# Patient Record
Sex: Male | Born: 1970
Health system: Southern US, Community
[De-identification: ages and names within clinical notes are randomized; demographics above are authoritative.]

## PROBLEM LIST (undated history)

## (undated) DIAGNOSIS — H532 Diplopia: Principal | ICD-10-CM

## (undated) DIAGNOSIS — T7840XA Allergy, unspecified, initial encounter: Secondary | ICD-10-CM

## (undated) DIAGNOSIS — G7000 Myasthenia gravis without (acute) exacerbation: Principal | ICD-10-CM

## (undated) HISTORY — PX: NO PAST SURGERIES: SHX2092

## (undated) HISTORY — DX: Diplopia: H53.2

## (undated) HISTORY — DX: Myasthenia gravis without (acute) exacerbation: G70.00

## (undated) HISTORY — DX: Allergy, unspecified, initial encounter: T78.40XA

---

## 2009-01-11 ENCOUNTER — Ambulatory Visit: Payer: Self-pay | Admitting: Sports Medicine

## 2009-01-11 DIAGNOSIS — M629 Disorder of muscle, unspecified: Secondary | ICD-10-CM | POA: Insufficient documentation

## 2009-01-11 DIAGNOSIS — M25559 Pain in unspecified hip: Secondary | ICD-10-CM | POA: Insufficient documentation

## 2016-05-24 ENCOUNTER — Other Ambulatory Visit (HOSPITAL_COMMUNITY): Payer: Self-pay | Admitting: Internal Medicine

## 2016-05-24 ENCOUNTER — Ambulatory Visit (HOSPITAL_COMMUNITY)
Admission: RE | Admit: 2016-05-24 | Discharge: 2016-05-24 | Disposition: A | Discharge status: Home / Self Care | Payer: BLUE CROSS/BLUE SHIELD | Source: Ambulatory Visit | Attending: Internal Medicine | Admitting: Internal Medicine

## 2016-05-24 DIAGNOSIS — R519 Headache, unspecified: Secondary | ICD-10-CM

## 2016-05-24 DIAGNOSIS — H539 Unspecified visual disturbance: Secondary | ICD-10-CM | POA: Insufficient documentation

## 2016-05-24 DIAGNOSIS — R51 Headache: Secondary | ICD-10-CM | POA: Diagnosis not present

## 2016-05-24 DIAGNOSIS — H5310 Unspecified subjective visual disturbances: Secondary | ICD-10-CM | POA: Diagnosis not present

## 2016-05-24 DIAGNOSIS — H532 Diplopia: Secondary | ICD-10-CM | POA: Diagnosis not present

## 2016-05-24 DIAGNOSIS — H04123 Dry eye syndrome of bilateral lacrimal glands: Secondary | ICD-10-CM | POA: Diagnosis not present

## 2016-05-24 DIAGNOSIS — R9089 Other abnormal findings on diagnostic imaging of central nervous system: Secondary | ICD-10-CM | POA: Diagnosis not present

## 2016-05-24 DIAGNOSIS — Z6822 Body mass index (BMI) 22.0-22.9, adult: Secondary | ICD-10-CM | POA: Diagnosis not present

## 2016-05-24 MED ORDER — GADOBENATE DIMEGLUMINE 529 MG/ML IV SOLN
15.0000 mL | Freq: Once | INTRAVENOUS | Status: AC | PRN
  Administered 2016-05-24: 15 mL via INTRAVENOUS

## 2016-06-07 DIAGNOSIS — H532 Diplopia: Secondary | ICD-10-CM | POA: Diagnosis not present

## 2016-06-07 DIAGNOSIS — H5015 Alternating exotropia: Secondary | ICD-10-CM | POA: Diagnosis not present

## 2016-06-07 DIAGNOSIS — H04123 Dry eye syndrome of bilateral lacrimal glands: Secondary | ICD-10-CM | POA: Diagnosis not present

## 2016-06-11 DIAGNOSIS — H539 Unspecified visual disturbance: Secondary | ICD-10-CM | POA: Diagnosis not present

## 2016-06-11 DIAGNOSIS — H5034 Intermittent alternating exotropia: Secondary | ICD-10-CM | POA: Diagnosis not present

## 2016-06-11 DIAGNOSIS — H0520 Unspecified exophthalmos: Secondary | ICD-10-CM | POA: Diagnosis not present

## 2016-06-11 DIAGNOSIS — H5021 Vertical strabismus, right eye: Secondary | ICD-10-CM | POA: Diagnosis not present

## 2016-06-13 ENCOUNTER — Telehealth: Payer: Self-pay | Admitting: *Deleted

## 2016-06-13 NOTE — Telephone Encounter (Signed)
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  Dr. Jannifer Franklin sent a staff message requesting he be placed on his schedule.

## 2016-06-13 NOTE — Telephone Encounter (Signed)
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

## 2016-06-25 ENCOUNTER — Encounter: Payer: Self-pay | Admitting: Neurology

## 2016-06-25 ENCOUNTER — Ambulatory Visit (INDEPENDENT_AMBULATORY_CARE_PROVIDER_SITE_OTHER): Payer: BLUE CROSS/BLUE SHIELD | Admitting: Neurology

## 2016-06-25 VITALS — BP 114/76 | HR 58 | Ht 71.0 in | Wt 166.5 lb

## 2016-06-25 DIAGNOSIS — H532 Diplopia: Secondary | ICD-10-CM

## 2016-06-25 HISTORY — DX: Diplopia: H53.2

## 2016-06-25 MED ORDER — PYRIDOSTIGMINE BROMIDE 60 MG PO TABS: 60.0000 mg | ORAL_TABLET | Freq: Four times a day (QID) | ORAL | Status: DC

## 2016-06-25 NOTE — Progress Notes (Signed)
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 The patient was driving at the time, he noted onset of some double vision and he noted that if he closed one eye or the other the double vision would go away. The patient may have blurring of vision but not overt double vision at times. He was seen by Dr. Annamaria Boots from ophthalmology, he was told that myasthenia gravis could be a possibility. MRI of the brain has been done and shows minimal nonspecific white matter changes. The patient has had blood work that includes a thyroid profile, thyroid peroxidase antibody, thyroglobulin antibody, and an ACH antibody, binding, that were all normal. The patient has been placed on Mestinon, he appears to have some benefit taking 60 mg 3 times daily. He was given a prescription for prednisone, but he did not start this. The patient does believe that he gets worse as the day goes on. If he rests briefly, the double vision seems to improve. The double vision may be variable, sometimes horizontal, sometimes vertical, sometimes skew. He denies any ptosis whatsoever. He denies any weakness of the extremities or numbness of the extremities. Denies any balance issues or difficulty controlling the bowels or the bladder. 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  Past Medical History  Diagnosis Date  . Diplopia 06/25/2016    History reviewed. No pertinent past surgical history.  Family History  Problem Relation Age of Onset  . Autoimmune disease Mother     Social history:  reports that he has never smoked. He does not have any smokeless tobacco history on file. He reports that he drinks alcohol. He reports that he does not use illicit drugs.  Medications:  Prior to Admission  medications   Not on File      Allergies  Allergen Reactions  . Amoxicillin Rash    ROS:  Out of a complete 14 system review of symptoms, the patient complains only of the following symptoms, and all other reviewed systems are negative.  Blurred vision, double vision Dizziness  Blood pressure 114/76, pulse 58, height 5\' 11"  (1.803 m), weight 166 lb 8 oz (75.524 kg).  Physical Exam  General: The patient is alert and cooperative at the time of the examination.  Eyes: Pupils are equal, round, and reactive to light. Discs are flat bilaterally.  Neck: The neck is supple, no carotid bruits are noted.  Respiratory: The respiratory examination is clear.  Cardiovascular: The cardiovascular examination reveals a regular rate and rhythm, no obvious murmurs or rubs are noted.  Skin: Extremities are without significant edema.  Neurologic Exam  Mental status: The patient is alert and oriented x 3 at the time of the examination. The patient has apparent normal recent and remote memory, with an apparently normal attention span and concentration ability.  Cranial nerves: Facial symmetry is present. There is good sensation of the face to pinprick and soft touch bilaterally. The strength of the facial muscles and the muscles to head turning and shoulder shrug are normal bilaterally. Speech is well enunciated, no aphasia or dysarthria is noted. Extraocular movements are full, with exception that there is incomplete adduction of the right eye with left lateral gaze. Visual fields are full. The tongue is midline, and  the patient has symmetric elevation of the soft palate. No obvious hearing deficits are noted. With superior gaze for 1 minute, there is exotropia of the right eye. No ptosis is seen.  Motor: The motor testing reveals 5 over 5 strength of all 4 extremities. Good symmetric motor tone is noted throughout. With arms outstretched 1 minute, no fatigable weakness of the deltoid muscles is  noted.  Sensory: Sensory testing is intact to pinprick, soft touch, vibration sensation, and position sense on all 4 extremities. No evidence of extinction is noted.  Coordination: Cerebellar testing reveals good finger-nose-finger and heel-to-shin bilaterally.  Gait and station: Gait is normal. Tandem gait is normal. Romberg is negative. No drift is seen.  Reflexes: Deep tendon reflexes are symmetric and normal bilaterally. Toes are downgoing bilaterally.    MRI brain 05/24/16:   IMPRESSION: 1. No acute intracranial abnormality. 2. 3 punctate foci of white matter T2 signal abnormality, nonspecific and not necessarily abnormal for patient's age. No changes specifically suggestive of demyelinating disease. 3. Unremarkable appearance of the orbits.  * MRI scan images were reviewed online. I agree with the written report.   Assessment/Plan:  1. Double vision, possible myasthenia gravis  The blood work so far for myasthenia gravis is negative, the patient may be seronegative with ocular myasthenia. The patient will be sent for further blood work, and he will have a CT scan of the chest. The patient appears to desire a referral to [facility], he wishes to have a thymectomy performed. I indicated that a thymectomy in an ocular myasthenic is not indicated. We may consider referral however for single fiber EMG if the response to the Mestinon is not definite. We will go up on the Mestinon taking 60 mg 4 times daily. He will follow-up in 2 months.  Jill Alexanders MD 06/25/2016 9:01 PM  [facility] [address] [place], [place] [address]  Phone [phone] Fax [phone]

## 2016-07-02 ENCOUNTER — Ambulatory Visit
Admission: RE | Admit: 2016-07-02 | Discharge: 2016-07-02 | Disposition: A | Discharge status: Home / Self Care | Payer: BLUE CROSS/BLUE SHIELD | Source: Ambulatory Visit | Attending: Neurology | Admitting: Neurology

## 2016-07-02 ENCOUNTER — Telehealth: Payer: Self-pay | Admitting: Neurology

## 2016-07-02 DIAGNOSIS — H532 Diplopia: Secondary | ICD-10-CM

## 2016-07-02 DIAGNOSIS — G7000 Myasthenia gravis without (acute) exacerbation: Secondary | ICD-10-CM | POA: Diagnosis not present

## 2016-07-02 MED ORDER — IOPAMIDOL (ISOVUE-300) INJECTION 61%
75.0000 mL | Freq: Once | INTRAVENOUS | Status: AC | PRN
  Administered 2016-07-02: 75 mL via INTRAVENOUS

## 2016-07-02 NOTE — Telephone Encounter (Signed)
Patient called, request that order for single or sickle fiber order be faxed to [facility] [phone], Attn: Shericka/Dr. April Manson

## 2016-07-02 NOTE — Telephone Encounter (Signed)
I called the patient, CT the chest is unremarkable. We are getting a referral set up to [facility] for a single fiber EMG.

## 2016-07-02 NOTE — Telephone Encounter (Signed)
Marquisha/[facility] [phone] called to get clarification on an order.

## 2016-07-02 NOTE — Telephone Encounter (Signed)
Returned [employer] and spoke to [place]. Clarified diagnosis for CT chest.

## 2016-07-02 NOTE — Telephone Encounter (Signed)
Patient is calling to get the results of his blood work and would like a referral to [facility].  Please call.

## 2016-07-02 NOTE — Telephone Encounter (Signed)
I called patient. The patient is having worsening of symptoms with double vision. He is now on a tapering dose of prednisone. Indicates that the Mestinon is often very little benefit. The blood work is normal with exception of a minimally elevated ANA, one of the ascitic: Receptor antibody levels is not yet back.  We will need sickle fiber EMG, I'll refer to [facility] for this. The patient will be seen by Dr. Queen Blossom or Associates.

## 2016-07-02 NOTE — Telephone Encounter (Signed)
Referral info/order faxed to Attn: Shericka/Dr. Wendee Beavers @ [facility] as requested.  Referral to [facility] Dr. Queen Blossom or Associates RE: double vision, requests single fiber EMG to confirm or exclude diagnosis of myasthenia gravis

## 2016-07-04 DIAGNOSIS — H5021 Vertical strabismus, right eye: Secondary | ICD-10-CM | POA: Diagnosis not present

## 2016-07-04 DIAGNOSIS — H539 Unspecified visual disturbance: Secondary | ICD-10-CM | POA: Diagnosis not present

## 2016-07-04 DIAGNOSIS — R7989 Other specified abnormal findings of blood chemistry: Secondary | ICD-10-CM | POA: Diagnosis not present

## 2016-07-15 LAB — COMPREHENSIVE METABOLIC PANEL
ALBUMIN: 4.9 g/dL (ref 3.5–5.5)
ALK PHOS: 50 IU/L (ref 39–117)
ALT: 22 IU/L (ref 0–44)
AST: 20 IU/L (ref 0–40)
Albumin/Globulin Ratio: 1.9 (ref 1.2–2.2)
BUN / CREAT RATIO: 18 (ref 9–20)
BUN: 16 mg/dL (ref 6–24)
Bilirubin Total: 0.8 mg/dL (ref 0.0–1.2)
CALCIUM: 9.6 mg/dL (ref 8.7–10.2)
CO2: 26 mmol/L (ref 18–29)
CREATININE: 0.9 mg/dL (ref 0.76–1.27)
Chloride: 97 mmol/L (ref 96–106)
GFR, EST AFRICAN AMERICAN: 117 mL/min/{1.73_m2} (ref >59)
GFR, EST NON AFRICAN AMERICAN: 101 mL/min/{1.73_m2} (ref >59)
GLOBULIN, TOTAL: 2.6 g/dL (ref 1.5–4.5)
GLUCOSE: 85 mg/dL (ref 65–99)
Potassium: 4 mmol/L (ref 3.5–5.2)
Sodium: 139 mmol/L (ref 134–144)
TOTAL PROTEIN: 7.5 g/dL (ref 6.0–8.5)

## 2016-07-15 LAB — VITAMIN B12: Vitamin B-12: 385 pg/mL (ref 211–946)

## 2016-07-15 LAB — ENA+DNA/DS+SJORGEN'S
DSDNA AB: 1 [IU]/mL (ref 0–9)
ENA RNP AB: 1.1 AI — AB (ref 0.0–0.9)
ENA SM Ab Ser-aCnc: 0.2 AI (ref 0.0–0.9)
ENA SSA (RO) Ab: 0.3 AI (ref 0.0–0.9)
ENA SSB (LA) Ab: <0.2 AI (ref 0.0–0.9)

## 2016-07-15 LAB — ANGIOTENSIN CONVERTING ENZYME: Angio Convert Enzyme: 32 U/L (ref 14–82)

## 2016-07-15 LAB — ACETYLCHOLINE RECEPTOR, BLOCKING: ACETYLCHOL BLOCK AB: 21 % (ref 0–25)

## 2016-07-15 LAB — ANA W/REFLEX: ANA: POSITIVE — AB

## 2016-07-15 LAB — CK: Total CK: 99 U/L (ref 24–204)

## 2016-07-15 LAB — ACETYLCHOLINE RECEPTOR, MODULATING

## 2016-07-17 ENCOUNTER — Telehealth: Payer: Self-pay

## 2016-07-17 DIAGNOSIS — G7000 Myasthenia gravis without (acute) exacerbation: Secondary | ICD-10-CM | POA: Diagnosis not present

## 2016-07-17 DIAGNOSIS — H5021 Vertical strabismus, right eye: Secondary | ICD-10-CM | POA: Diagnosis not present

## 2016-07-17 NOTE — Telephone Encounter (Signed)
-----   Message from Kathrynn Ducking, MD sent at 07/16/2016  3:28 PM EDT ----- Blood work is unremarkable with exception of positive ANA, but in low titer, not likely to be clinically significant. Please call the patient. ----- Message -----    From: [facility] Lab Results In Interface    Sent: 06/26/2016   7:42 AM      To: Kathrynn Ducking, MD

## 2016-07-17 NOTE — Telephone Encounter (Signed)
Called pt w/ unremarkable lab results other than exception noted below. May call back w/ additional questions/concerns.

## 2016-07-23 ENCOUNTER — Telehealth: Payer: Self-pay | Admitting: Neurology

## 2016-07-23 NOTE — Telephone Encounter (Signed)
Pt called about single fiber test on 8/24 at [facility]. He wants to have a referral sent to [facility] to see if he can get in sooner. He was told that if the office did it as urgent they could get him in sooner.  Please call [phone]

## 2016-07-25 NOTE — Telephone Encounter (Signed)
Gracey they are booking into October.

## 2016-07-25 NOTE — Telephone Encounter (Signed)
Patient called again about the referral has been [facility] to [facility].  Thanks!

## 2016-07-25 NOTE — Telephone Encounter (Signed)
Called and spoke to Patient called Dr. Queen Blossom office to try and see if I Can get him seen sooner . Left message for Dr. Bobbye Morton 's office a message to call me back. [phone].

## 2016-07-26 ENCOUNTER — Telehealth: Payer: Self-pay | Admitting: Neurology

## 2016-07-26 DIAGNOSIS — H532 Diplopia: Secondary | ICD-10-CM

## 2016-07-26 NOTE — Telephone Encounter (Signed)
The patient contacted me by email, he has done well with the double vision initially, but it is now come back. He has an appointment at [facility] for single fiber EMG toward the end of the month. He feels that he cannot wait that long, wishes to have a referral to [facility] instead. I will try to get the referral set up.

## 2016-07-27 NOTE — Telephone Encounter (Signed)
Dr. Jannifer Franklin I have been talking to patient and I had already contacted [facility] and they did not have any opening's until October. I called [facility] and they are going  to see the patient on 08/03/2016. Patient is fine with this and he will follow up with you on 08/10/2016. Patient will try and get Records from [facility] before he has apt with you and bring. Thanks Hinton Dyer.

## 2016-07-27 NOTE — Telephone Encounter (Signed)
Events noted, having the patient seen on August 4 is adequate.

## 2016-08-08 DIAGNOSIS — G7000 Myasthenia gravis without (acute) exacerbation: Secondary | ICD-10-CM | POA: Diagnosis not present

## 2016-08-10 ENCOUNTER — Encounter: Payer: Self-pay | Admitting: Neurology

## 2016-08-10 ENCOUNTER — Ambulatory Visit (INDEPENDENT_AMBULATORY_CARE_PROVIDER_SITE_OTHER): Payer: BLUE CROSS/BLUE SHIELD | Admitting: Neurology

## 2016-08-10 DIAGNOSIS — G7000 Myasthenia gravis without (acute) exacerbation: Secondary | ICD-10-CM | POA: Diagnosis not present

## 2016-08-10 HISTORY — DX: Myasthenia gravis without (acute) exacerbation: G70.00

## 2016-08-10 MED ORDER — MYCOPHENOLATE MOFETIL 500 MG PO TABS: 500.0000 mg | ORAL_TABLET | Freq: Two times a day (BID) | ORAL | 1 refills | Status: DC

## 2016-08-10 MED ORDER — PREDNISONE 5 MG PO TABS: ORAL_TABLET | ORAL | 1 refills | Status: AC

## 2016-08-10 NOTE — Patient Instructions (Addendum)
Myasthenia Gravis Myasthenia gravis (MG) means severe weakness. It is a long-term (chronic) condition that causes weakness in the muscles you can control (voluntary muscles). MG can affect any voluntary muscle. The muscles most often affected are the ones that control:   Eye movement.  Facial movements.  Swallowing. MG is an autoimmune disease, which means that your body's defense system (immune system) attacks healthy parts of your body instead of germs and other things that make you sick. When you have MG, your immune system makes proteins (antibodies) that block the chemical (acetylcholine) your body needs to send nerve signals to your muscles. This causes muscle weakness. CAUSES  The exact cause of MG is unknown. One possible cause is an enlarged thymus gland, which is located under your breastbone.  SIGNS AND SYMPTOMS The earliest symptom of MG is muscle weakness that gets worse with activity and gets better after rest. Other symptoms of MG may include:  Drooping eyelids.  Double vision.  Loss of facial expression.  Trouble chewing and swallowing.  Slurred speech.  A waddling walk.  Weakness of the arms, hands, and legs. Trouble breathing is the most dangerous symptom of MG. Sudden and severe difficulty breathing (myasthenic crisis) may require emergency breathing support. This symptom sometimes happens after:   Infection.  Fever.  Drug reaction. DIAGNOSIS  It can be hard to diagnose MG because muscle weakness is a common symptom in many conditions. Your health care provider will do a physical exam. You may also have tests that will help make a diagnosis. These may include:  A blood test.  A test using the medicine edrophonium. This medicine increases muscle strength by slowing the breakdown of acetylcholine.  Tests to measure nerve conduction to muscle (electromyography).  An imaging study of the chest (CT or MRI). TREATMENT  Treatment can improve muscle strength.  Sometimes symptoms of MG go away for a while (remission) and you can stop treatment. Possible treatments include:  Medicine.  Removal of the thymus gland (thymectomy). This may result in a long remission for some people. HOME CARE INSTRUCTIONS  Take medicines only as directed by your health care provider.  Get plenty of rest to conserve your energy.  Take frequent breaks to rest your eyes.  Maintain a healthy diet and a healthy weight.  Do not use any tobacco products including cigarettes, chewing tobacco, or electronic cigarettes. If you need help quitting, ask your health care provider.  Keep all follow-up visits as directed by your health care provider. This is important. SEEK MEDICAL CARE IF:  Your symptoms get worse after a fever or infection.  You have a reaction to a medicine you are taking.  Your symptoms change or get worse. SEEK IMMEDIATE MEDICAL CARE IF: You have trouble breathing.    This information is not intended to replace advice given to you by your health care provider. Make sure you discuss any questions you have with your health care provider.   Document Released: 03/25/2001 Document Revised: 01/07/2015 Document Reviewed: 02/17/2014 Elsevier Interactive Patient Education 2016 Elsevier Inc.  

## 2016-08-10 NOTE — Progress Notes (Signed)
Reason for visit: Myasthenia gravis  Mark Gregory is an 45 y.o. 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 The patient has been to [facility], he has had repetitive nerve stimulation done and single fiber EMG that confirmed the diagnosis of myasthenia gravis. The patient currently is on Mestinon with some benefit, he has good days and bad days with the double vision. He has double vision that is more prominent when he is looking up. He denies any significant ptosis, occasionally the right eye will become droopy. He denies issues with speech, swallowing, or weakness of extremities. He has not had any problems with breathing. He returns for an evaluation. He takes Mestinon usually 60 mg 4 times daily.  Past Medical History:  Diagnosis Date  . Diplopia 06/25/2016    History reviewed. No pertinent surgical history.  Family History  Problem Relation Age of Onset  . Autoimmune disease Mother     Social history:  reports that he has never smoked. He has never used smokeless tobacco. He reports that he drinks about 0.6 oz of alcohol per week . He reports that he does not use drugs.    Allergies  Allergen Reactions  . Amoxicillin Rash    Medications:  Prior to Admission medications   Medication Sig Start Date End Date Taking? Authorizing Provider  aspirin 325 MG tablet Take 325 mg by mouth daily.   Yes Historical Provider, MD  Cetirizine HCl 10 MG CAPS Take by mouth.   Yes Historical Provider, MD  fluticasone (FLONASE) 50 MCG/ACT nasal spray Place into the nose.   Yes Historical Provider, MD  Multiple Vitamin (MULTIVITAMIN) tablet Take 1 tablet by mouth daily.   Yes Historical Provider, MD  pyridostigmine (MESTINON) 60 MG tablet Take 1 tablet (60 mg total) by mouth 4 (four) times daily. 06/25/16  Yes Kathrynn Ducking, MD    ROS:  Out of a complete 14 system review of symptoms, the patient complains  only of the following symptoms, and all other reviewed systems are negative.  Double vision  Blood pressure 105/64, pulse 66, height 5\' 11"  (1.803 m), weight 160 lb (72.6 kg).  Physical Exam  General: The patient is alert and cooperative at the time of the examination.  Skin: No significant peripheral edema is noted.   Neurologic Exam  Mental status: The patient is alert and oriented x 3 at the time of the examination. The patient has apparent normal recent and remote memory, with an apparently normal attention span and concentration ability.   Cranial nerves: Facial symmetry is present. Speech is normal, no aphasia or dysarthria is noted. Extraocular movements are full, with exception that the left eye does not have superior deviation.Nicki [place] fields are full. With superior gaze for 1 minute, there is some drift downward of the left eye, no increase in ptosis is seen.  Motor: The patient has good strength in all 4 extremities. With the arms outstretched 1 minute, no fatigable weakness of the deltoid muscle is seen.  Sensory examination: Soft touch sensation is symmetric on the face, arms, and legs.  Coordination: The patient has good finger-nose-finger and heel-to-shin bilaterally.  Gait and station: The patient has a normal gait. Tandem gait is normal. Romberg is negative. No drift is seen.  Reflexes: Deep tendon reflexes are symmetric.   Assessment/Plan:  1. Myasthenia gravis with ocular features  The patient has a prism on the right  lens for the double vision. The patient has weakness primarily the left eye with superior deviation. We will start prednisone at 20 mg daily, we may go up on the dose depending on how he is doing, he is to contact our office in 2 weeks. We will start CellCept 500 mg twice daily. We will follow-up in 3 months, check blood work at that time. He will remain on Mestinon.  Jill Alexanders MD 08/10/2016 7:45 AM  [facility] [address] [place], [place] [address]  Phone [phone] Fax [phone]

## 2016-08-13 ENCOUNTER — Telehealth: Payer: Self-pay | Admitting: Neurology

## 2016-08-13 NOTE — Telephone Encounter (Signed)
Evelyn/[facility] [phone] called to inquire if we received PA request that was faxed to our office Friday morning August 11th for medication mycophenolate (CELLCEPT) 500 MG tablet.

## 2016-08-13 NOTE — Telephone Encounter (Signed)
Received fax and PA is in progress. Called [facility] back and let them know that we'll call when approval/denial is received.

## 2016-08-14 DIAGNOSIS — G7000 Myasthenia gravis without (acute) exacerbation: Secondary | ICD-10-CM | POA: Diagnosis not present

## 2016-08-14 DIAGNOSIS — H5021 Vertical strabismus, right eye: Secondary | ICD-10-CM | POA: Diagnosis not present

## 2016-08-14 NOTE — Telephone Encounter (Signed)
[place] to notify that no PA required for requested medication per rejection in prime; MUST [place]. Pt was contacted and instructed to [place] # ON THE BACK OF THE [place] ID CARD FOR MORE INFO. Pharmacy is currently waiting for pt's ok to fill medication through mail order pharmacy.

## 2016-09-25 DIAGNOSIS — H5051 Esophoria: Secondary | ICD-10-CM | POA: Diagnosis not present

## 2016-09-25 DIAGNOSIS — G7000 Myasthenia gravis without (acute) exacerbation: Secondary | ICD-10-CM | POA: Diagnosis not present

## 2016-09-25 DIAGNOSIS — H5021 Vertical strabismus, right eye: Secondary | ICD-10-CM | POA: Diagnosis not present

## 2016-10-02 DIAGNOSIS — Z23 Encounter for immunization: Secondary | ICD-10-CM | POA: Diagnosis not present

## 2016-10-02 DIAGNOSIS — Z79899 Other long term (current) drug therapy: Secondary | ICD-10-CM | POA: Diagnosis not present

## 2016-10-02 DIAGNOSIS — Z7952 Long term (current) use of systemic steroids: Secondary | ICD-10-CM | POA: Diagnosis not present

## 2016-10-02 DIAGNOSIS — G7000 Myasthenia gravis without (acute) exacerbation: Secondary | ICD-10-CM | POA: Diagnosis not present

## 2016-11-16 ENCOUNTER — Ambulatory Visit: Payer: BLUE CROSS/BLUE SHIELD | Admitting: Neurology

## 2016-11-27 DIAGNOSIS — G7000 Myasthenia gravis without (acute) exacerbation: Secondary | ICD-10-CM | POA: Diagnosis not present

## 2017-01-04 DIAGNOSIS — Z0000 Encounter for general adult medical examination without abnormal findings: Secondary | ICD-10-CM | POA: Diagnosis not present

## 2017-01-04 DIAGNOSIS — Z125 Encounter for screening for malignant neoplasm of prostate: Secondary | ICD-10-CM | POA: Diagnosis not present

## 2017-01-11 DIAGNOSIS — H539 Unspecified visual disturbance: Secondary | ICD-10-CM | POA: Diagnosis not present

## 2017-01-11 DIAGNOSIS — Z1389 Encounter for screening for other disorder: Secondary | ICD-10-CM | POA: Diagnosis not present

## 2017-01-11 DIAGNOSIS — R7989 Other specified abnormal findings of blood chemistry: Secondary | ICD-10-CM | POA: Diagnosis not present

## 2017-01-11 DIAGNOSIS — G7000 Myasthenia gravis without (acute) exacerbation: Secondary | ICD-10-CM | POA: Diagnosis not present

## 2017-01-11 DIAGNOSIS — Z0000 Encounter for general adult medical examination without abnormal findings: Secondary | ICD-10-CM | POA: Diagnosis not present

## 2017-01-11 DIAGNOSIS — H0520 Unspecified exophthalmos: Secondary | ICD-10-CM | POA: Diagnosis not present

## 2017-03-05 DIAGNOSIS — H5021 Vertical strabismus, right eye: Secondary | ICD-10-CM | POA: Diagnosis not present

## 2017-03-05 DIAGNOSIS — G7000 Myasthenia gravis without (acute) exacerbation: Secondary | ICD-10-CM | POA: Diagnosis not present

## 2017-03-20 ENCOUNTER — Other Ambulatory Visit: Payer: Self-pay | Admitting: Neurology

## 2017-04-12 IMAGING — MR MR ORBITS WO/W CM
9 of 20 series · 28 of 48 positions shown · IV contrast (multihance)
Comparison: None.

CLINICAL DATA: Headaches and visual changes. Concern for multiple
sclerosis.

EXAM:
MRI HEAD AND ORBITS WITHOUT AND WITH CONTRAST
TECHNIQUE: Multiplanar, multiecho pulse sequences of the brain and surrounding
structures were obtained without and with intravenous contrast.
Multiplanar, multiecho pulse sequences of the orbits and surrounding
structures were obtained including fat saturation techniques, before
and after intravenous contrast administration.
CONTRAST:  15mL MULTIHANCE GADOBENATE DIMEGLUMINE 529 MG/ML IV SOLN

[Series 4: DWI · axial · 3.0mm · 1.09mm/px · z∈[-65,+79]mm · 5 of 98 slices shown (1 of 4)]
[im 1/98]
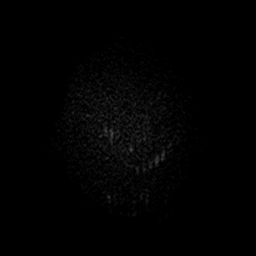
[im 25/98]
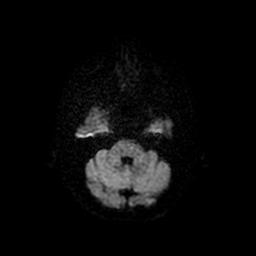
[im 49/98]
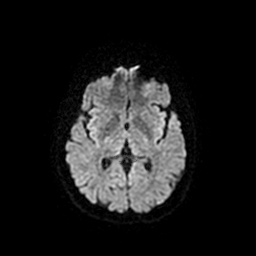
[im 73/98]
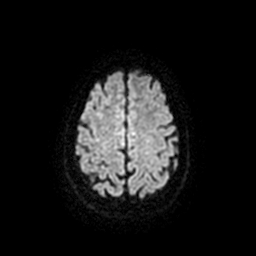
[im 98/98]
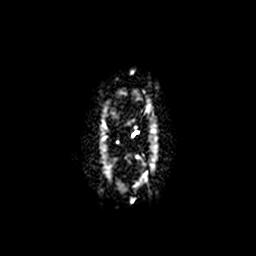

[Series 5: DWI · coronal · 5.0mm · 1.09mm/px · 4 of 74 slices shown (2 of 4)]
[im 1/74]
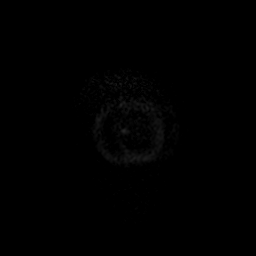
[im 25/74]
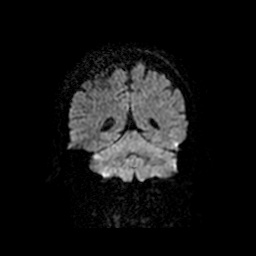
[im 49/74]
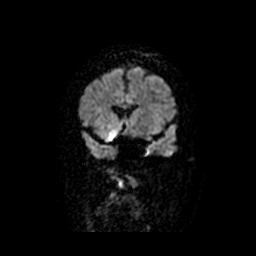
[im 74/74]
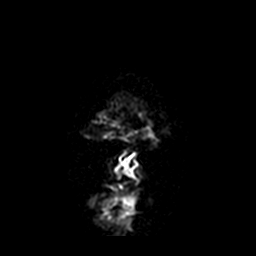

[Series 7: FLAIR · axial · 5.0mm · 0.43mm/px · 1 of 24 slices shown (1 of 2)]
[im 1/24]
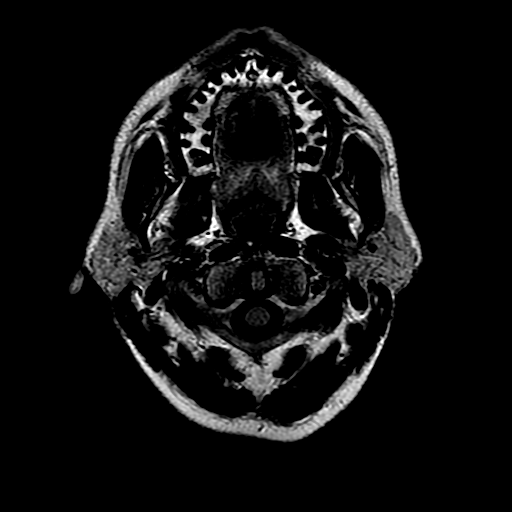

[Series 10: FLAIR · sagittal · 1.6mm · 0.47mm/px · 11 of 224 slices shown (2 of 2)]
[im 1/224]
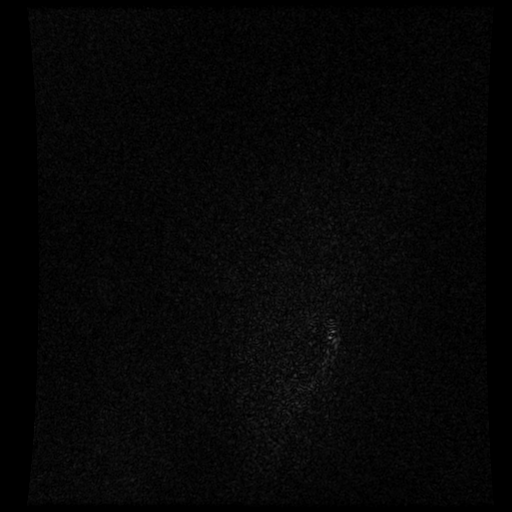
[im 23/224]
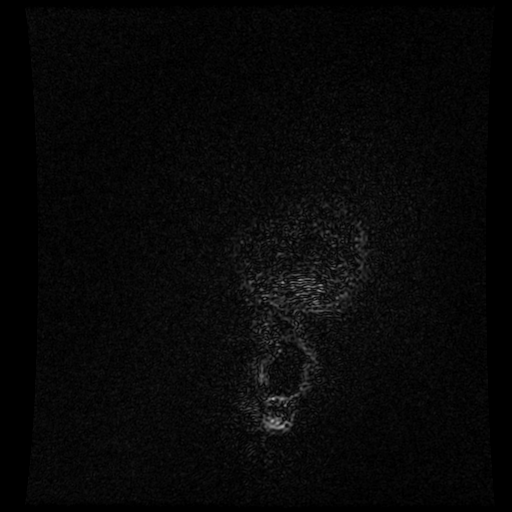
[im 45/224]
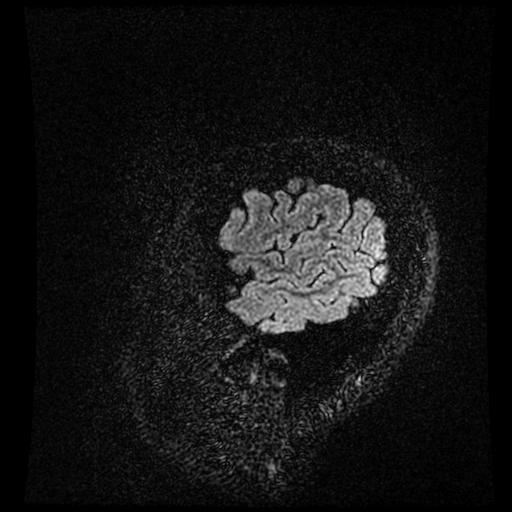
[im 67/224]
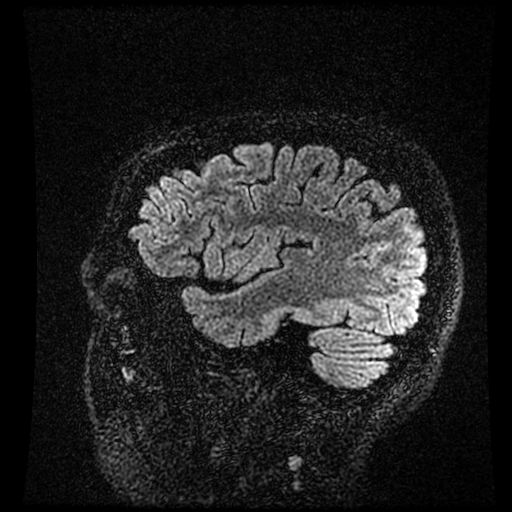
[im 90/224]
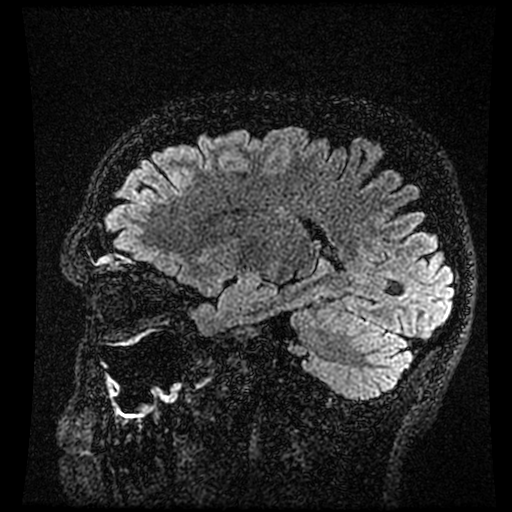
[im 112/224]
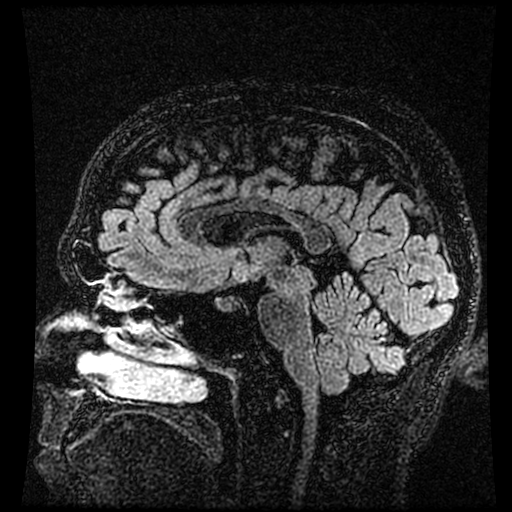
[im 134/224]
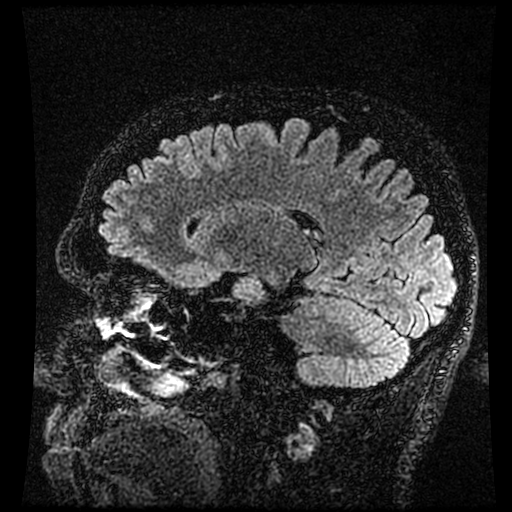
[im 157/224]
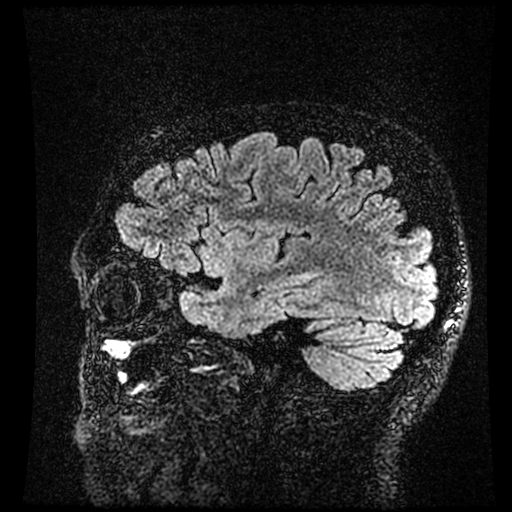
[im 179/224]
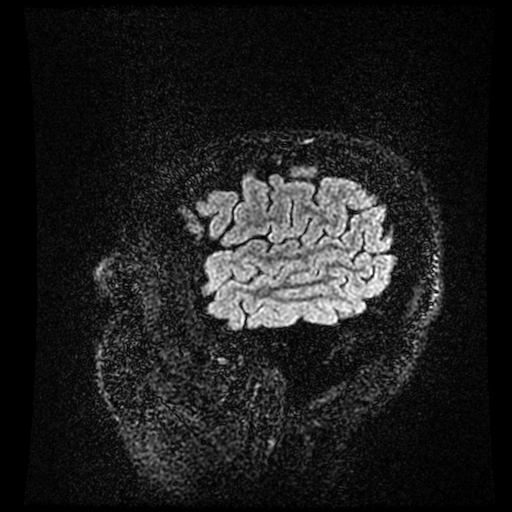
[im 201/224]
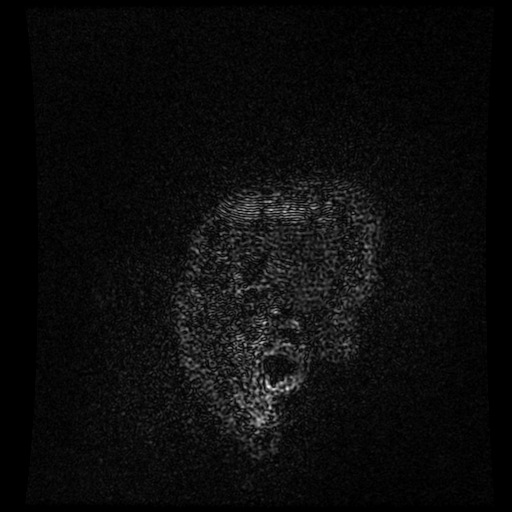
[im 224/224]
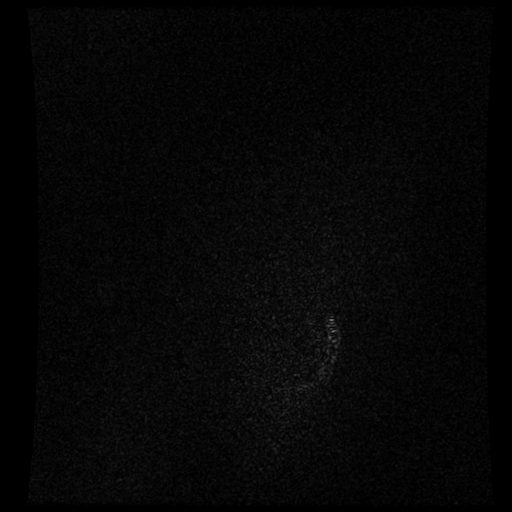

[Series 16: T1 post-contrast · axial · 3.0mm · 0.35mm/px · 1 of 18 slices shown (1 of 3)]
[im 1/18]
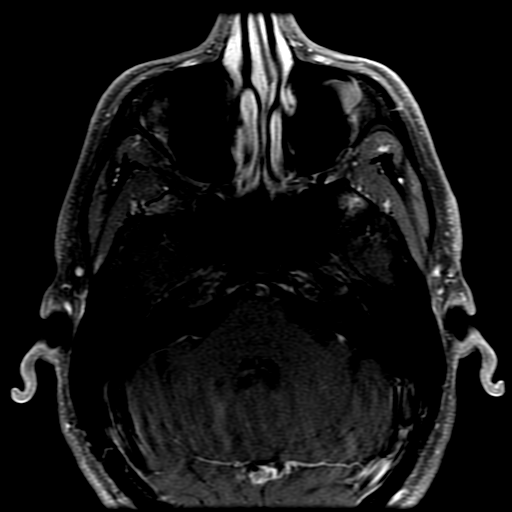

[Series 17: T1 post-contrast · coronal · 3.0mm · 0.35mm/px · 1 of 26 slices shown (2 of 3)]
[im 1/26]
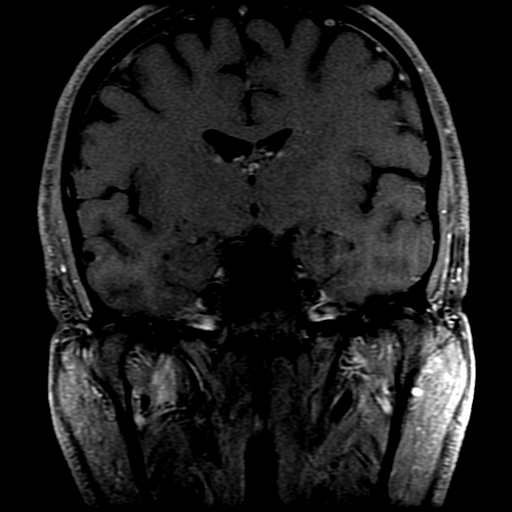

[Series 19: T1 post-contrast · coronal · 5.0mm · 0.45mm/px · 1 of 28 slices shown (3 of 3)]
[im 1/28]
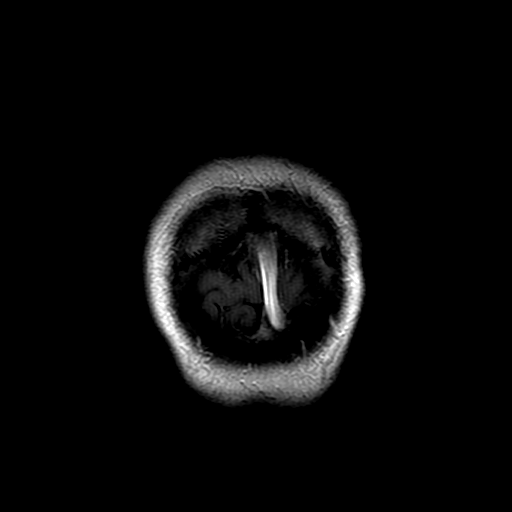

[Series 400: DWI · axial · 3.0mm · 1.09mm/px · z∈[-65,+79]mm · 2 of 49 slices shown (3 of 4)]
[im 1/49]
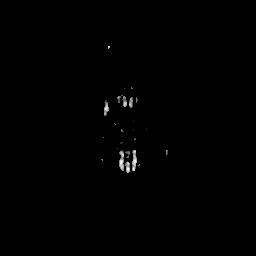
[im 49/49]
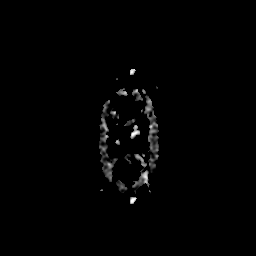

[Series 500: DWI · coronal · 5.0mm · 1.09mm/px · 2 of 37 slices shown (4 of 4)]
[im 1/37]
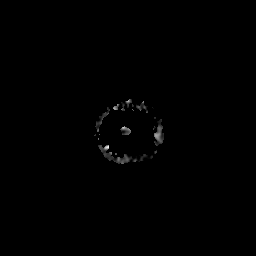
[im 37/37]
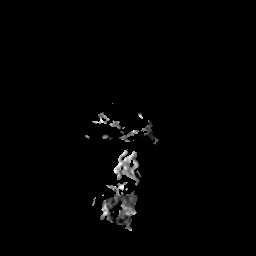

[28 of 48 positions shown; findings below may reference images not displayed]

FINDINGS: MRI HEAD FINDINGS

There is no evidence of acute infarct, intracranial hemorrhage,
mass, midline shift, or extra-axial fluid collection. Ventricles and
sulci are normal. There are 3 punctate foci of T2 hyperintensity
within the subcortical white matter of the frontal lobes no
periventricular lesions are seen. The corpus callosum, brainstem,
and cerebellum are unremarkable. No abnormal enhancement is seen.

Left maxillary sinus mucous retention cysts are noted. There is mild
right frontal, bilateral ethmoid, and bilateral maxillary sinus
mucosal thickening. Mastoid air cells are clear. Major intracranial
vascular flow voids are preserved.

MRI ORBITS FINDINGS

The optic nerves are symmetric in size without definite evidence of
optic nerve edema or abnormal enhancement. The optic chiasm is
unremarkable. The globes appear intact. The extraocular muscles and
lacrimal glands are unremarkable. No orbital mass or inflammatory
change is seen. The pituitary is unremarkable.
IMPRESSION: 1. No acute intracranial abnormality.
2. 3 punctate foci of white matter T2 signal abnormality,
nonspecific and not necessarily abnormal for patient's age. No
changes specifically suggestive of demyelinating disease.
3. Unremarkable appearance of the orbits.

## 2017-05-21 IMAGING — CT CT CHEST W/ CM
3 of 5 series · 17 of 36 positions shown, 19 images · IV contrast (APPLIED)
Comparison: None.

CLINICAL DATA: Myasthenia gravis, evaluate for thymoma.  Diplopia.

EXAM:
CT CHEST WITH CONTRAST
TECHNIQUE: Multidetector CT imaging of the chest was performed during
intravenous contrast administration.
CONTRAST:  75mL [id number] IOPAMIDOL ([id number]) INJECTION 61%

[Series 2: chest w/cm · axial · 0.68mm/px · z∈[-109,+91]mm · 6 of 160 slices shown]
[im 20/160  lung]
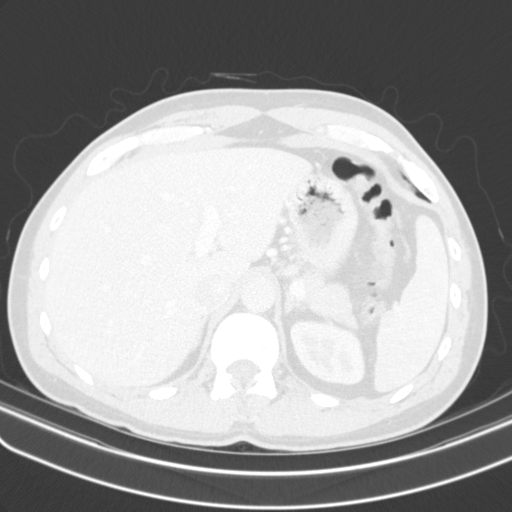
[im 40/160  lung]
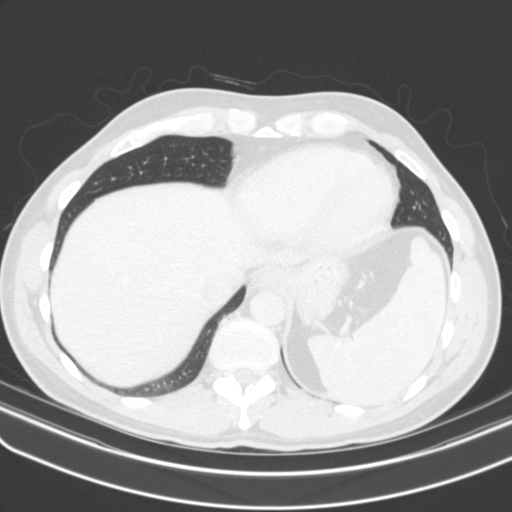
[im 60/160  lung]
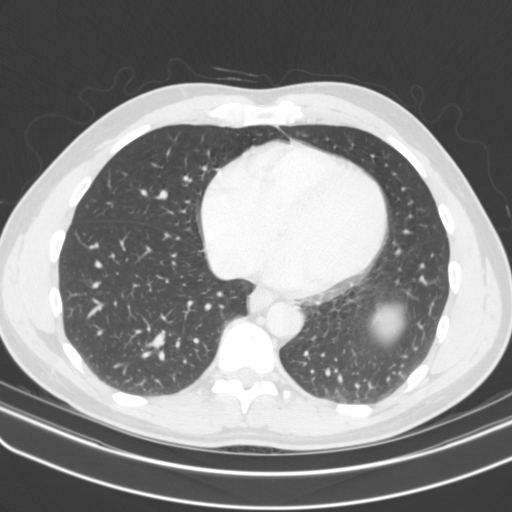
[im 80/160  lung]
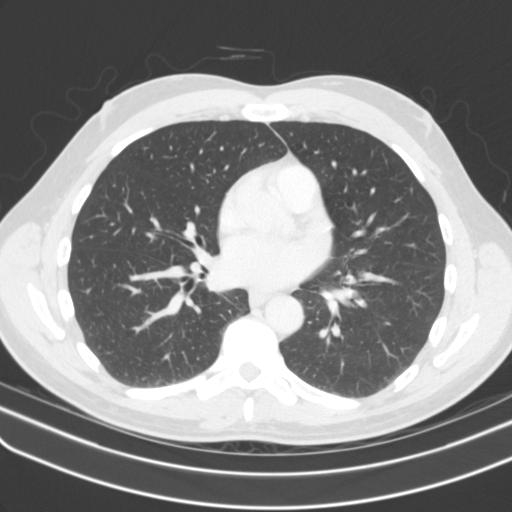
[im 100/160  lung]
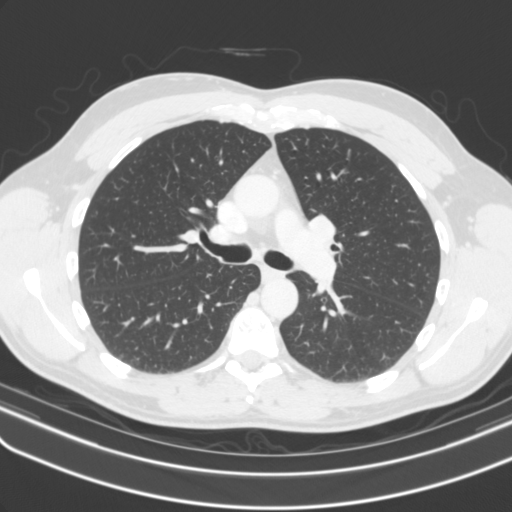
[im 120/160  lung]
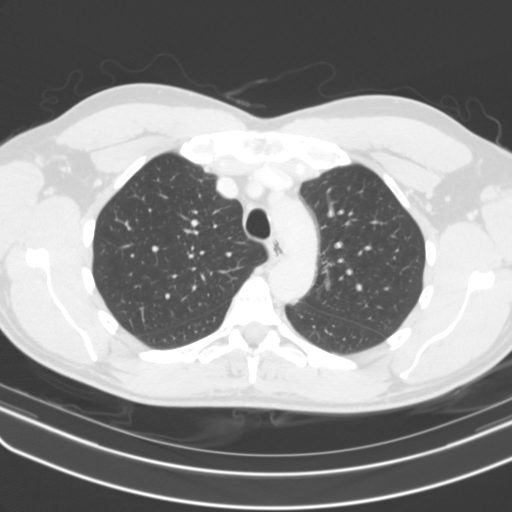

[Series 3: cor · coronal · 0.62mm/px · 3 of 119 slices shown]
[im 24/119  lung]
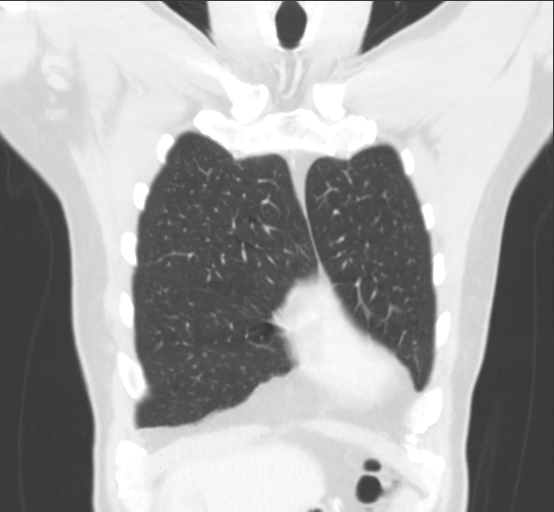
[im 48/119  lung]
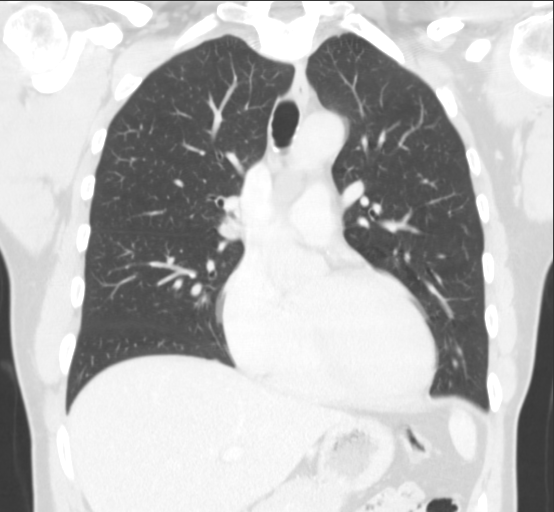
[im 71/119  lung]
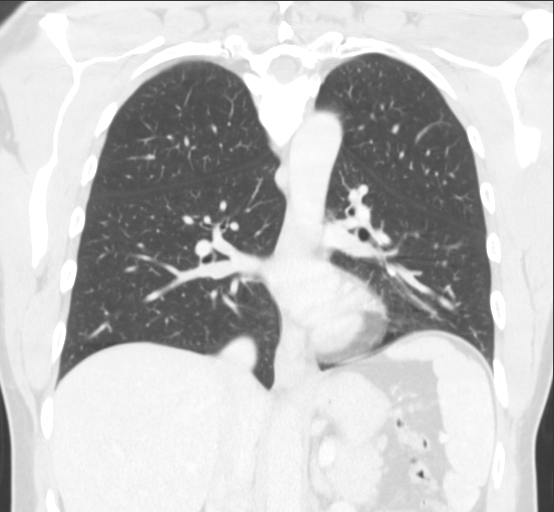

[Series 6: super d · axial · 0.68mm/px · z∈[-113,+135]mm · 8 of 160 slices shown, 10 images]
[im 18/160  mediastinal]
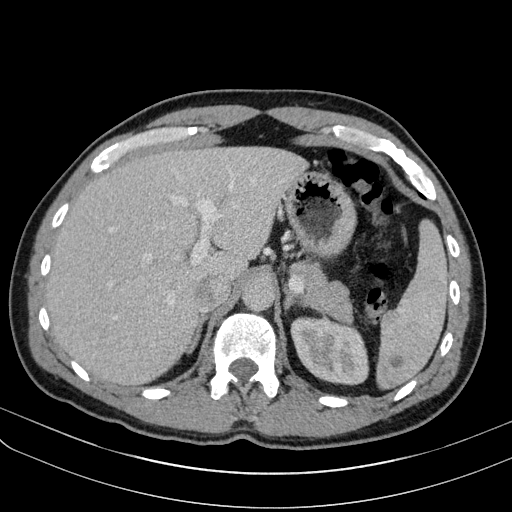
[im 18/160  lung]
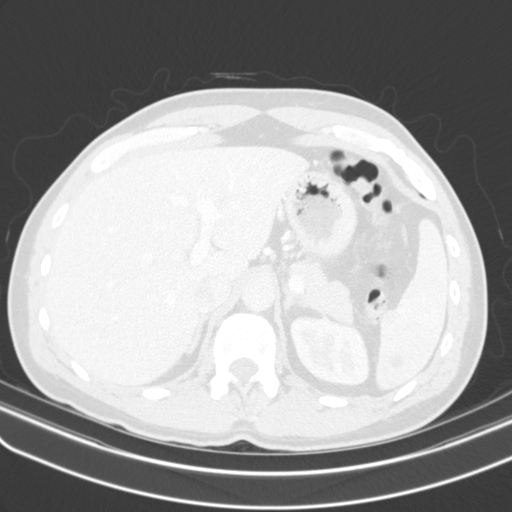
[im 36/160  lung]
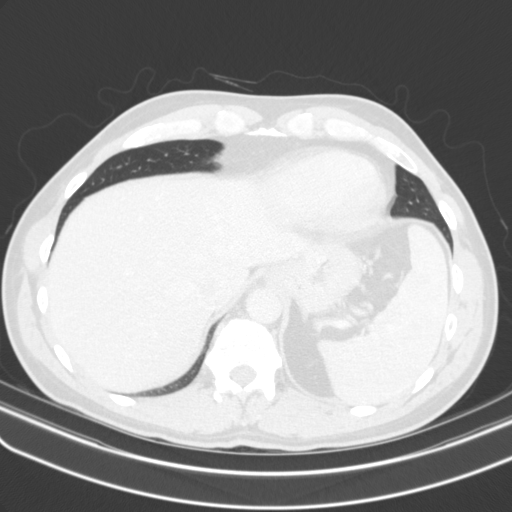
[im 54/160  lung]
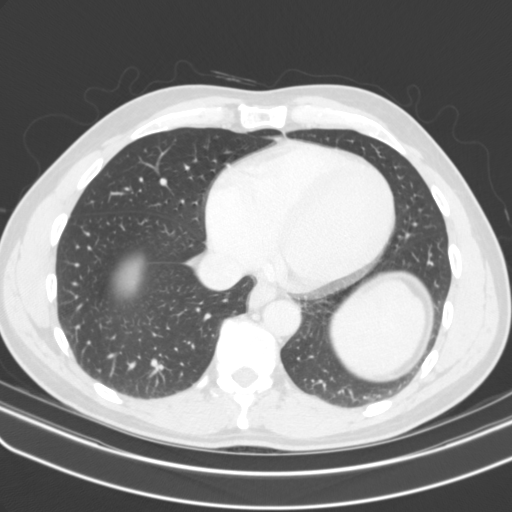
[im 71/160  lung]
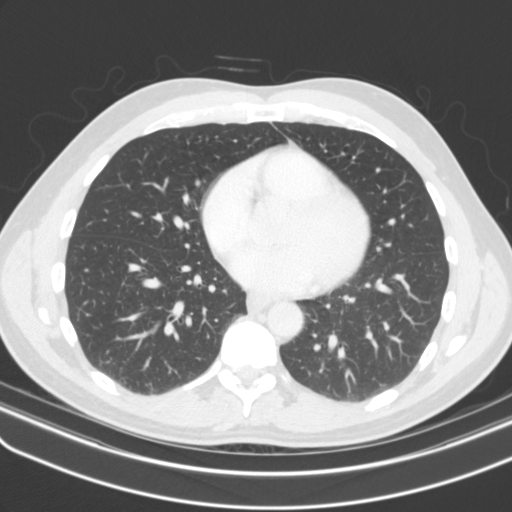
[im 89/160  mediastinal]
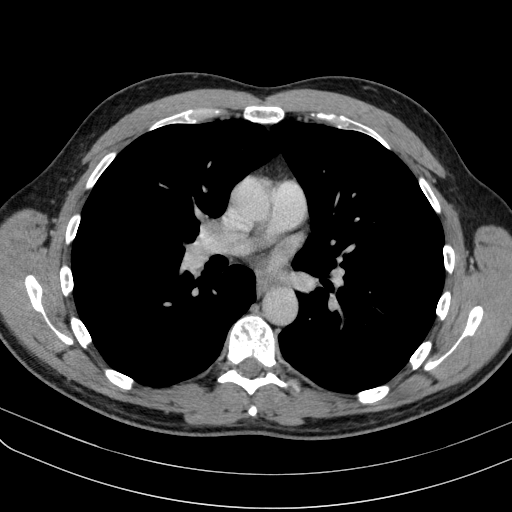
[im 89/160  lung]
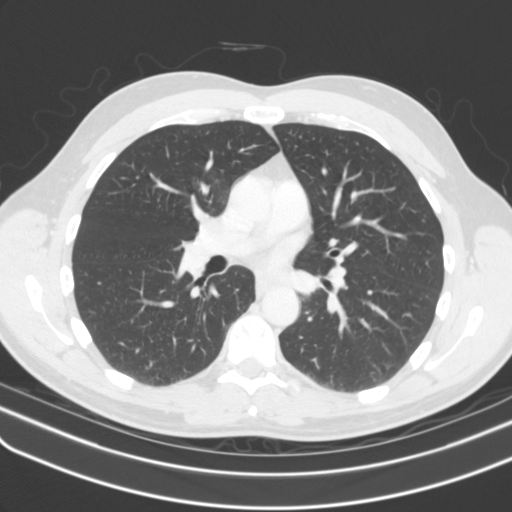
[im 107/160  lung]
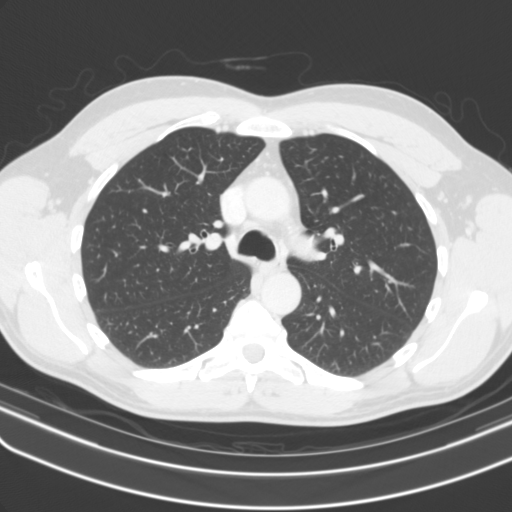
[im 124/160  lung]
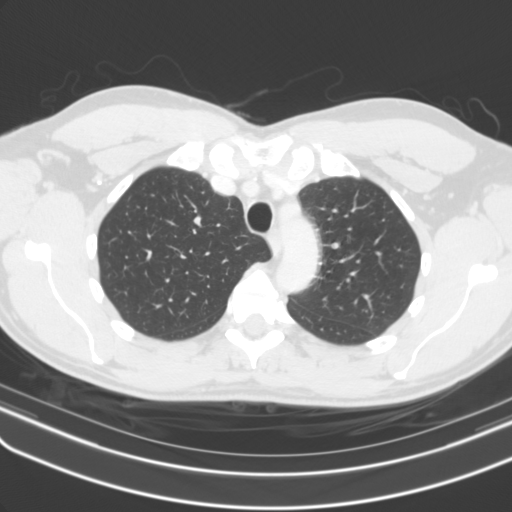
[im 142/160  lung]
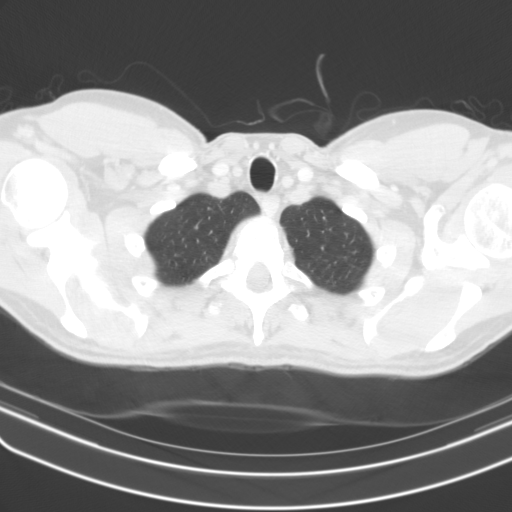

[17 of 36 positions shown; findings below may reference images not displayed]

FINDINGS: Cardiovascular: Minimal coronary artery calcification. Heart size
normal. Vascular structures are otherwise unremarkable.

Mediastinum/Nodes: There are scattered sub cm short axis mediastinal
lymph nodes. No mediastinal mass. No hilar or axillary adenopathy.

Lungs/Pleura: Very minimal dependent atelectasis bilaterally. Lungs
are otherwise clear. No pleural fluid. Airway is unremarkable.

Upper Abdomen: Visualized portions of the liver, adrenal glands and
kidneys are unremarkable. Focal fat in segment 4 of the liver is a
normal variant. 12 mm low-attenuation lesion in the posterior aspect
of the spleen is difficult to further characterize due to size but
is likely a benign lesion such as a cyst. Small splenule. Visualized
portions of the pancreas, stomach and bowel are grossly
unremarkable. Mild haziness adjacent to the celiac trunk is
nonspecific. No discrete adenopathy.

Musculoskeletal: No worrisome lytic or sclerotic lesions.
IMPRESSION: No thymic or mediastinal mass.

## 2017-06-21 DIAGNOSIS — H019 Unspecified inflammation of eyelid: Secondary | ICD-10-CM | POA: Diagnosis not present

## 2017-07-04 DIAGNOSIS — L710 Perioral dermatitis: Secondary | ICD-10-CM | POA: Diagnosis not present

## 2018-01-30 DIAGNOSIS — H524 Presbyopia: Secondary | ICD-10-CM | POA: Diagnosis not present

## 2018-03-21 DIAGNOSIS — Z1389 Encounter for screening for other disorder: Secondary | ICD-10-CM | POA: Diagnosis not present

## 2018-03-21 DIAGNOSIS — J309 Allergic rhinitis, unspecified: Secondary | ICD-10-CM | POA: Diagnosis not present

## 2018-03-21 DIAGNOSIS — Z125 Encounter for screening for malignant neoplasm of prostate: Secondary | ICD-10-CM | POA: Diagnosis not present

## 2018-03-21 DIAGNOSIS — G7000 Myasthenia gravis without (acute) exacerbation: Secondary | ICD-10-CM | POA: Diagnosis not present

## 2018-03-21 DIAGNOSIS — Z0000 Encounter for general adult medical examination without abnormal findings: Secondary | ICD-10-CM | POA: Diagnosis not present

## 2018-03-21 DIAGNOSIS — E785 Hyperlipidemia, unspecified: Secondary | ICD-10-CM | POA: Diagnosis not present

## 2019-01-30 DIAGNOSIS — H524 Presbyopia: Secondary | ICD-10-CM | POA: Diagnosis not present

## 2019-05-07 DIAGNOSIS — J309 Allergic rhinitis, unspecified: Secondary | ICD-10-CM | POA: Diagnosis not present

## 2019-05-08 DIAGNOSIS — Z0000 Encounter for general adult medical examination without abnormal findings: Secondary | ICD-10-CM | POA: Diagnosis not present

## 2019-05-08 DIAGNOSIS — Z125 Encounter for screening for malignant neoplasm of prostate: Secondary | ICD-10-CM | POA: Diagnosis not present

## 2019-05-11 DIAGNOSIS — Z0000 Encounter for general adult medical examination without abnormal findings: Secondary | ICD-10-CM | POA: Diagnosis not present

## 2020-01-22 DIAGNOSIS — M25651 Stiffness of right hip, not elsewhere classified: Secondary | ICD-10-CM | POA: Diagnosis not present

## 2020-01-22 DIAGNOSIS — M545 Low back pain: Secondary | ICD-10-CM | POA: Diagnosis not present

## 2020-01-22 DIAGNOSIS — M25551 Pain in right hip: Secondary | ICD-10-CM | POA: Diagnosis not present

## 2020-02-04 DIAGNOSIS — H524 Presbyopia: Secondary | ICD-10-CM | POA: Diagnosis not present

## 2020-02-04 DIAGNOSIS — H5213 Myopia, bilateral: Secondary | ICD-10-CM | POA: Diagnosis not present

## 2020-07-13 ENCOUNTER — Encounter: Payer: Self-pay | Admitting: Internal Medicine

## 2020-08-25 ENCOUNTER — Other Ambulatory Visit: Payer: Self-pay

## 2020-08-25 ENCOUNTER — Ambulatory Visit (AMBULATORY_SURGERY_CENTER): Payer: Self-pay

## 2020-08-25 VITALS — Ht 71.0 in | Wt 165.2 lb

## 2020-08-25 DIAGNOSIS — Z1211 Encounter for screening for malignant neoplasm of colon: Secondary | ICD-10-CM

## 2020-08-25 MED ORDER — SUTAB 1479-225-188 MG PO TABS: 1.0000 | ORAL_TABLET | ORAL | 0 refills | Status: DC

## 2020-08-25 NOTE — Progress Notes (Signed)
No egg or soy allergy known to patient  No issues with past sedation with any surgeries or procedures No intubation problems in the past  No FH of Malignant Hyperthermia No diet pills per patient No home 02 use per patient  No blood thinners per patient  Pt denies issues with constipation  No A fib or A flutter  EMMI video to pt and to MyChart per pt request COVID 19 guidelines implemented in PV today with Pt and RN  Coupon given to pt in PV today , Code to Pharmacy  COVID vaccines completed on 01/2020 per pt; Due to the COVID-19 pandemic we are asking patients to follow these guidelines. Please only bring one care partner. Please be aware that your care partner may wait in the car in the parking lot or if they feel like they will be too hot to wait in the car, they may wait in the lobby on the 4th floor. All care partners are required to wear a mask the entire time (we do not have any that we can provide them), they need to practice social distancing, and we will do a Covid check for all patient's and care partners when you arrive. Also we will check their temperature and your temperature. If the care partner waits in their car they need to stay in the parking lot the entire time and we will call them on their cell phone when the patient is ready for discharge so they can bring the car to the front of the building. Also all patient's will need to wear a mask into building.

## 2020-08-26 ENCOUNTER — Encounter: Payer: Self-pay | Admitting: Internal Medicine

## 2020-09-09 ENCOUNTER — Ambulatory Visit (AMBULATORY_SURGERY_CENTER): Payer: 59 | Admitting: Internal Medicine

## 2020-09-09 ENCOUNTER — Other Ambulatory Visit: Payer: Self-pay

## 2020-09-09 ENCOUNTER — Encounter: Payer: Self-pay | Admitting: Internal Medicine

## 2020-09-09 VITALS — BP 110/67 | HR 49 | Temp 97.5°F | Resp 11 | Ht 71.0 in | Wt 165.0 lb

## 2020-09-09 DIAGNOSIS — D123 Benign neoplasm of transverse colon: Secondary | ICD-10-CM | POA: Diagnosis not present

## 2020-09-09 DIAGNOSIS — Z1211 Encounter for screening for malignant neoplasm of colon: Secondary | ICD-10-CM | POA: Diagnosis not present

## 2020-09-09 DIAGNOSIS — D125 Benign neoplasm of sigmoid colon: Secondary | ICD-10-CM | POA: Diagnosis not present

## 2020-09-09 DIAGNOSIS — D122 Benign neoplasm of ascending colon: Secondary | ICD-10-CM | POA: Diagnosis not present

## 2020-09-09 DIAGNOSIS — K635 Polyp of colon: Secondary | ICD-10-CM | POA: Diagnosis not present

## 2020-09-09 MED ORDER — SODIUM CHLORIDE 0.9 % IV SOLN: 500.0000 mL | INTRAVENOUS | Status: DC

## 2020-09-09 NOTE — Progress Notes (Signed)
Report given to PACU, vss 

## 2020-09-09 NOTE — Op Note (Signed)
[facility] Patient Name: Mark Gregory Procedure Date: 09/09/2020 7:37 AM MRN: [id number] Endoscopist: Jerene Bears , MD Age: 49 Referring MD:  Date of Birth: June 08, 1971 Gender: Male Account #: [id number] Procedure:                Colonoscopy Indications:              Screening for colorectal malignant neoplasm, This                            is the patient's first colonoscopy Medicines:                Monitored Anesthesia Care Procedure:                Pre-Anesthesia Assessment:                           - Prior to the procedure, a History and Physical                            was performed, and patient medications and                            allergies were reviewed. The patient's tolerance of                            previous anesthesia was also reviewed. The risks                            and benefits of the procedure and the sedation                            options and risks were discussed with the patient.                            All questions were answered, and informed consent                            was obtained. Prior Anticoagulants: The patient has                            taken no previous anticoagulant or antiplatelet                            agents. ASA Grade Assessment: II - A patient with                            mild systemic disease. After reviewing the risks                            and benefits, the patient was deemed in                            satisfactory condition to undergo the procedure.  After obtaining informed consent, the colonoscope                            was passed under direct vision. Throughout the                            procedure, the patient's blood pressure, pulse, and                            oxygen saturations were monitored continuously. The                            Colonoscope was introduced through the anus and                            advanced to the cecum,  identified by appendiceal                            orifice and ileocecal valve. The colonoscopy was                            performed without difficulty. The patient tolerated                            the procedure well. The quality of the bowel                            preparation was good. The ileocecal valve,                            appendiceal orifice, and rectum were photographed. Scope In: 8:04:55 AM Scope Out: 8:23:34 AM Scope Withdrawal Time: 0 hours 15 minutes 21 seconds  Total Procedure Duration: 0 hours 18 minutes 39 seconds  Findings:                 The digital rectal exam was normal.                           Two sessile polyps were found in the ascending                            colon. The polyps were 3 and 10 mm in size. These                            polyps were removed with a cold snare. Resection                            and retrieval were complete.                           A 5 mm polyp was found in the transverse colon. The                            polyp was sessile.  The polyp was removed with a                            cold snare. Resection and retrieval were complete.                           A 6 mm polyp was found in the distal sigmoid colon.                            The polyp was sessile. The polyp was removed with a                            cold snare. Resection and retrieval were complete.                           The exam was otherwise without abnormality on                            direct and retroflexion views. Complications:            No immediate complications. Estimated Blood Loss:     Estimated blood loss was minimal. Impression:               - Two 3 and 10 mm polyps in the ascending colon,                            removed with a cold snare. Resected and retrieved.                           - One 5 mm polyp in the transverse colon, removed                            with a cold snare. Resected and retrieved.                            - One 6 mm polyp in the distal sigmoid colon,                            removed with a cold snare. Resected and retrieved.                           - The examination was otherwise normal on direct                            and retroflexion views. Recommendation:           - Patient has a contact number available for                            emergencies. The signs and symptoms of potential                            delayed complications were discussed with the  patient. Return to normal activities tomorrow.                            Written discharge instructions were provided to the                            patient.                           - Resume previous diet.                           - Continue present medications.                           - Await pathology results.                           - Repeat colonoscopy is recommended. The                            colonoscopy date will be determined after pathology                            results from today's exam become available for                            review. Jerene Bears, MD 09/09/2020 8:30:53 AM This report has been signed electronically.

## 2020-09-09 NOTE — Patient Instructions (Signed)
  Handout on polyps given to you today   Await pathology results on polyps removed    YOU HAD AN ENDOSCOPIC PROCEDURE TODAY AT THE [facility]:   Refer to the procedure report that was given to you for any specific questions about what was found during the examination.  If the procedure report does not answer your questions, please call your gastroenterologist to clarify.  If you requested that your care partner not be given the details of your procedure findings, then the procedure report has been included in a sealed envelope for you to review at your convenience later.  YOU SHOULD EXPECT: Some feelings of bloating in the abdomen. Passage of more gas than usual.  Walking can help get rid of the air that was put into your GI tract during the procedure and reduce the bloating. If you had a lower endoscopy (such as a colonoscopy or flexible sigmoidoscopy) you may notice spotting of blood in your stool or on the toilet paper. If you underwent a bowel prep for your procedure, you may not have a normal bowel movement for a few days.  Please Note:  You might notice some irritation and congestion in your nose or some drainage.  This is from the oxygen used during your procedure.  There is no need for concern and it should clear up in a day or so.  SYMPTOMS TO REPORT IMMEDIATELY:  Following lower endoscopy (colonoscopy or flexible sigmoidoscopy):  Excessive amounts of blood in the stool  Significant tenderness or worsening of abdominal pains  Swelling of the abdomen that is new, acute  Fever of 100F or higher  For urgent or emergent issues, a gastroenterologist can be reached at any hour by calling [phone]. Do not use MyChart messaging for urgent concerns.    DIET:  We do recommend a small meal at first, but then you may proceed to your regular diet.  Drink plenty of fluids but you should avoid alcoholic beverages for 24 hours.  ACTIVITY:  You should plan to take it easy  for the rest of today and you should NOT DRIVE or use heavy machinery until tomorrow (because of the sedation medicines used during the test).    FOLLOW UP: Our staff will call the number listed on your records 48-72 hours following your procedure to check on you and address any questions or concerns that you may have regarding the information given to you following your procedure. If we do not reach you, we will leave a message.  We will attempt to reach you two times.  During this call, we will ask if you have developed any symptoms of COVID 19. If you develop any symptoms (ie: fever, flu-like symptoms, shortness of breath, cough etc.) before then, please call [phone].  If you test positive for Covid 19 in the 2 weeks post procedure, please call and report this information to us.    If any biopsies were taken you will be contacted by phone or by letter within the next 1-3 weeks.  Please call us at [phone] if you have not heard about the biopsies in 3 weeks.    SIGNATURES/CONFIDENTIALITY: You and/or your care partner have signed paperwork which will be entered into your electronic medical record.  These signatures attest to the fact that that the information above on your After Visit Summary has been reviewed and is understood.  Full responsibility of the confidentiality of this discharge information lies with you and/or your care-partner.    your care-partner. 

## 2020-09-09 NOTE — Progress Notes (Signed)
Pt's states no medical or surgical changes since previsit or office visit. 

## 2020-09-09 NOTE — Progress Notes (Signed)
Called to room to assist during endoscopic procedure.  Patient ID and intended procedure confirmed with present staff. Received instructions for my participation in the procedure from the performing physician.  

## 2020-09-13 ENCOUNTER — Telehealth: Payer: Self-pay | Admitting: *Deleted

## 2020-09-13 NOTE — Telephone Encounter (Signed)
°  Follow up Call-  Call back number 09/09/2020  Post procedure Call Back phone  # [phone]  Permission to leave phone message Yes  Some recent data might be hidden     Patient questions:  Do you have a fever, pain , or abdominal swelling? No. Pain Score  0 *  Have you tolerated food without any problems? Yes.    Have you been able to return to your normal activities? Yes.    Do you have any questions about your discharge instructions: Diet   No. Medications  No. Follow up visit  No.  Do you have questions or concerns about your Care? No.  Actions: * If pain score is 4 or above: No action needed, pain <4.  1. Have you developed a fever since your procedure? no  2.   Have you had an respiratory symptoms (SOB or cough) since your procedure? no  3.   Have you tested positive for COVID 19 since your procedure no  4.   Have you had any family members/close contacts diagnosed with the COVID 19 since your procedure?  no   If yes to any of these questions please route to Joylene John, RN and Joella Prince, RN

## 2020-09-14 ENCOUNTER — Encounter: Payer: Self-pay | Admitting: Internal Medicine

## 2021-08-29 ENCOUNTER — Other Ambulatory Visit: Payer: Self-pay | Admitting: Internal Medicine

## 2021-08-29 DIAGNOSIS — E785 Hyperlipidemia, unspecified: Secondary | ICD-10-CM

## 2021-10-20 ENCOUNTER — Ambulatory Visit
Admission: RE | Admit: 2021-10-20 | Discharge: 2021-10-20 | Disposition: A | Discharge status: Home / Self Care | Payer: No Typology Code available for payment source | Source: Ambulatory Visit | Attending: Internal Medicine | Admitting: Internal Medicine

## 2021-10-20 DIAGNOSIS — E785 Hyperlipidemia, unspecified: Secondary | ICD-10-CM

## 2022-01-26 DIAGNOSIS — G7000 Myasthenia gravis without (acute) exacerbation: Secondary | ICD-10-CM | POA: Diagnosis not present

## 2022-08-17 DIAGNOSIS — G7000 Myasthenia gravis without (acute) exacerbation: Secondary | ICD-10-CM | POA: Diagnosis not present

## 2022-11-09 DIAGNOSIS — Z1211 Encounter for screening for malignant neoplasm of colon: Secondary | ICD-10-CM | POA: Diagnosis not present

## 2022-11-13 DIAGNOSIS — E785 Hyperlipidemia, unspecified: Secondary | ICD-10-CM | POA: Diagnosis not present

## 2022-11-13 DIAGNOSIS — Z125 Encounter for screening for malignant neoplasm of prostate: Secondary | ICD-10-CM | POA: Diagnosis not present

## 2022-11-16 DIAGNOSIS — Z1331 Encounter for screening for depression: Secondary | ICD-10-CM | POA: Diagnosis not present

## 2022-11-16 DIAGNOSIS — Z0000 Encounter for general adult medical examination without abnormal findings: Secondary | ICD-10-CM | POA: Diagnosis not present

## 2022-11-16 DIAGNOSIS — R82998 Other abnormal findings in urine: Secondary | ICD-10-CM | POA: Diagnosis not present

## 2022-11-16 DIAGNOSIS — Z1389 Encounter for screening for other disorder: Secondary | ICD-10-CM | POA: Diagnosis not present

## 2023-07-12 DIAGNOSIS — G7000 Myasthenia gravis without (acute) exacerbation: Secondary | ICD-10-CM | POA: Diagnosis not present

## 2023-08-09 DIAGNOSIS — G7000 Myasthenia gravis without (acute) exacerbation: Secondary | ICD-10-CM | POA: Diagnosis not present

## 2023-08-09 DIAGNOSIS — H35361 Drusen (degenerative) of macula, right eye: Secondary | ICD-10-CM | POA: Diagnosis not present

## 2023-09-06 ENCOUNTER — Encounter: Payer: Self-pay | Admitting: Internal Medicine

## 2023-09-27 DIAGNOSIS — E785 Hyperlipidemia, unspecified: Secondary | ICD-10-CM | POA: Diagnosis not present

## 2023-10-09 DIAGNOSIS — E7800 Pure hypercholesterolemia, unspecified: Secondary | ICD-10-CM | POA: Diagnosis not present

## 2023-11-14 ENCOUNTER — Encounter: Payer: Self-pay | Admitting: Internal Medicine

## 2023-11-14 ENCOUNTER — Ambulatory Visit: Payer: BC Managed Care – PPO

## 2023-11-14 VITALS — Ht 71.0 in | Wt 170.0 lb

## 2023-11-14 DIAGNOSIS — Z860100 Personal history of colon polyps, unspecified: Secondary | ICD-10-CM

## 2023-11-14 MED ORDER — SUTAB 1479-225-188 MG PO TABS: 12.0000 | ORAL_TABLET | ORAL | 0 refills | Status: DC

## 2023-11-14 NOTE — Progress Notes (Signed)
No egg or soy allergy known to patient  No issues known to pt with past sedation with any surgeries or procedures Patient denies ever being told they had issues or difficulty with intubation  No FH of Malignant Hyperthermia Pt is not on diet pills Pt is not on  home 02  Pt is not on blood thinners  Pt denies issues with constipation  No A fib or A flutter Have any cardiac testing pending--no  LOA: independent  Prep: sutab   Patient's chart reviewed by Cathlyn Parsons CNRA prior to previsit and patient appropriate for the [facility].  Previsit completed and red dot placed by patient's name on their procedure day (on provider's schedule).     PV competed with patient. Prep instructions sent via mychart and home address. SUTAB coupon code given to patient for price reduction.

## 2023-12-06 ENCOUNTER — Encounter: Payer: Self-pay | Admitting: Internal Medicine

## 2023-12-06 ENCOUNTER — Ambulatory Visit (AMBULATORY_SURGERY_CENTER): Payer: BC Managed Care – PPO | Admitting: Internal Medicine

## 2023-12-06 VITALS — BP 100/46 | HR 48 | Temp 98.1°F | Resp 13 | Ht 71.0 in | Wt 170.0 lb

## 2023-12-06 DIAGNOSIS — D120 Benign neoplasm of cecum: Secondary | ICD-10-CM

## 2023-12-06 DIAGNOSIS — Z1211 Encounter for screening for malignant neoplasm of colon: Secondary | ICD-10-CM | POA: Diagnosis not present

## 2023-12-06 DIAGNOSIS — Z860101 Personal history of adenomatous and serrated colon polyps: Secondary | ICD-10-CM

## 2023-12-06 DIAGNOSIS — Z860100 Personal history of colon polyps, unspecified: Secondary | ICD-10-CM

## 2023-12-06 MED ORDER — SODIUM CHLORIDE 0.9 % IV SOLN: 500.0000 mL | Freq: Once | INTRAVENOUS | Status: DC

## 2023-12-06 NOTE — Patient Instructions (Addendum)
 Resume previous diet Continue present medications Await pathology results Repeat colonoscopy in 5 years  Handouts/information given for polyps  YOU HAD AN ENDOSCOPIC PROCEDURE TODAY AT THE [facility]:   Refer to the procedure report that was given to you for any specific questions about what was found during the examination.  If the procedure report does not answer your questions, please call your gastroenterologist to clarify.  If you requested that your care partner not be given the details of your procedure findings, then the procedure report has been included in a sealed envelope for you to review at your convenience later.  YOU SHOULD EXPECT: Some feelings of bloating in the abdomen. Passage of more gas than usual.  Walking can help get rid of the air that was put into your GI tract during the procedure and reduce the bloating. If you had a lower endoscopy (such as a colonoscopy or flexible sigmoidoscopy) you may notice spotting of blood in your stool or on the toilet paper. If you underwent a bowel prep for your procedure, you may not have a normal bowel movement for a few days.  Please Note:  You might notice some irritation and congestion in your nose or some drainage.  This is from the oxygen used during your procedure.  There is no need for concern and it should clear up in a day or so.  SYMPTOMS TO REPORT IMMEDIATELY:  Following lower endoscopy (colonoscopy):  Excessive amounts of blood in the stool  Significant tenderness or worsening of abdominal pains  Swelling of the abdomen that is new, acute  Fever of 100F or higher  For urgent or emergent issues, a gastroenterologist can be reached at any hour by calling [phone]. Do not use MyChart messaging for urgent concerns.    DIET:  We do recommend a small meal at first, but then you may proceed to your regular diet.  Drink plenty of fluids but you should avoid alcoholic beverages for 24 hours.  ACTIVITY:  You  should plan to take it easy for the rest of today and you should NOT DRIVE or use heavy machinery until tomorrow (because of the sedation medicines used during the test).    FOLLOW UP: Our staff will call the number listed on your records the next business day following your procedure.  We will call around 7:15- 8:00 am to check on you and address any questions or concerns that you may have regarding the information given to you following your procedure. If we do not reach you, we will leave a message.     If any biopsies were taken you will be contacted by phone or by letter within the next 1-3 weeks.  Please call us at [phone] if you have not heard about the biopsies in 3 weeks.   SIGNATURES/CONFIDENTIALITY: You and/or your care partner have signed paperwork which will be entered into your electronic medical record.  These signatures attest to the fact that that the information above on your After Visit Summary has been reviewed and is understood.  Full responsibility of the confidentiality of this discharge information lies with you and/or your care-partner.

## 2023-12-06 NOTE — Op Note (Signed)
[facility] Patient Name: Mark Gregory Procedure Date: 12/06/2023 9:47 AM MRN: [id number] Endoscopist: Beverley Fiedler , MD, [id number] Age: 52 Referring MD:  Date of Birth: 05/29/1971 Gender: Male Account #: [id number] Procedure:                Colonoscopy Indications:              High risk colon cancer surveillance: Personal                            history of multiple (3 or more) polyps, Last                            colonoscopy: September 2021 - index exam (TA x 2,                            SSP x 1) Medicines:                Monitored Anesthesia Care Procedure:                Pre-Anesthesia Assessment:                           - Prior to the procedure, a History and Physical                            was performed, and patient medications and                            allergies were reviewed. The patient's tolerance of                            previous anesthesia was also reviewed. The risks                            and benefits of the procedure and the sedation                            options and risks were discussed with the patient.                            All questions were answered, and informed consent                            was obtained. Prior Anticoagulants: The patient has                            taken no anticoagulant or antiplatelet agents. ASA                            Grade Assessment: II - A patient with mild systemic                            disease. After reviewing the risks and benefits,  the patient was deemed in satisfactory condition to                            undergo the procedure.                           After obtaining informed consent, the colonoscope                            was passed under direct vision. Throughout the                            procedure, the patient's blood pressure, pulse, and                            oxygen saturations were monitored continuously. The                             CF [id number] #[id number] was introduced through the anus                            and advanced to the cecum, identified by                            appendiceal orifice and ileocecal valve. The                            colonoscopy was performed without difficulty. The                            patient tolerated the procedure well. The quality                            of the bowel preparation was excellent. The                            ileocecal valve, appendiceal orifice, and rectum                            were photographed. Scope In: 9:51:03 AM Scope Out: 10:02:49 AM Scope Withdrawal Time: 0 hours 9 minutes 28 seconds  Total Procedure Duration: 0 hours 11 minutes 46 seconds  Findings:                 The digital rectal exam was normal.                           A 2 mm polyp was found in the cecum. The polyp was                            sessile. The polyp was removed with a cold snare.                            Resection and retrieval were complete.  The exam was otherwise without abnormality on                            direct and retroflexion views. Complications:            No immediate complications. Estimated Blood Loss:     Estimated blood loss: none. Impression:               - One 2 mm polyp in the cecum, removed with a cold                            snare. Resected and retrieved.                           - The examination was otherwise normal on direct                            and retroflexion views. Recommendation:           - Patient has a contact number available for                            emergencies. The signs and symptoms of potential                            delayed complications were discussed with the                            patient. Return to normal activities tomorrow.                            Written discharge instructions were provided to the                            patient.                            - Resume previous diet.                           - Continue present medications.                           - Await pathology results.                           - Repeat colonoscopy in 5 years for surveillance. Beverley Fiedler, MD 12/06/2023 10:05:29 AM This report has been signed electronically.

## 2023-12-06 NOTE — Progress Notes (Signed)
Pt's states no medical or surgical changes since previsit or office visit. 

## 2023-12-06 NOTE — Progress Notes (Signed)
Report to PACU, RN, vss, BBS= Clear.  

## 2023-12-06 NOTE — Progress Notes (Signed)
GASTROENTEROLOGY PROCEDURE H&P NOTE   Primary Care Physician: Creola Corn, MD    Reason for Procedure:   Hx of adenomas and SSP  Plan:    Colonoscopy  Patient is appropriate for endoscopic procedure(s) in the ambulatory ([facility]) setting.  The nature of the procedure, as well as the risks, benefits, and alternatives were carefully and thoroughly reviewed with the patient. Ample time for discussion and questions allowed. The patient understood, was satisfied, and agreed to proceed.     HPI: Mark Gregory is a 52 y.o. male who presents for surveillance colonoscopy.  Medical history as below.  Tolerated the prep.  No recent chest pain or shortness of breath.  No abdominal pain today.  Past Medical History:  Diagnosis Date   Allergy    seasonal allergies   Diplopia 06/25/2016   Myasthenia gravis (HCC) 08/10/2016    Past Surgical History:  Procedure Laterality Date   NO PAST SURGERIES      Prior to Admission medications   Medication Sig Start Date End Date Taking? Authorizing Provider  Multiple Vitamin (MULTIVITAMIN) tablet Take 1 tablet by mouth daily.   Yes [provider]  predniSONE (DELTASONE) 5 MG tablet Begin one tablet daily, increase by one tablet a week until you get to 4 tablets daily Patient taking differently: Take 10 mg by mouth daily. 08/10/16  Yes York Spaniel, MD    Current Outpatient Medications  Medication Sig Dispense Refill   Multiple Vitamin (MULTIVITAMIN) tablet Take 1 tablet by mouth daily.     predniSONE (DELTASONE) 5 MG tablet Begin one tablet daily, increase by one tablet a week until you get to 4 tablets daily (Patient taking differently: Take 10 mg by mouth daily.) 360 tablet 1   Current Facility-Administered Medications  Medication Dose Route Frequency Provider Last Rate Last Admin   0.9 %  sodium chloride infusion  500 mL Intravenous Once Suhaib Guzzo, Carie Caddy, MD        Allergies as of 12/06/2023 - Review Complete 12/06/2023  Allergen  Reaction Noted   Amoxicillin Rash 06/25/2016    Family History  Problem Relation Age of Onset   Autoimmune disease Mother    Colon polyps Neg Hx    Colon cancer Neg Hx    Esophageal cancer Neg Hx    Rectal cancer Neg Hx    Stomach cancer Neg Hx     Social History   Socioeconomic History   Marital status: Married    Spouse name: Irving Burton   Number of children: 3   Years of education: DDS   Highest education level: Not on file  Occupational History   Not on file  Tobacco Use   Smoking status: Never   Smokeless tobacco: Never  Vaping Use   Vaping status: Never Used  Substance and Sexual Activity   Alcohol use: Yes    Alcohol/week: 14.0 standard drinks of alcohol    Types: 14 Cans of beer per week    Comment: 1-3 drinks per day   Drug use: No   Sexual activity: Not on file  Other Topics Concern   Not on file  Social History Narrative   Lives at home w/ his wife and children   Right-handed   Drinks 1-2 cups of coffee per day and occasional soda or iced tea   Social Determinants of Health   Financial Resource Strain: Not on file  Food Insecurity: Not on file  Transportation Needs: Not on file  Physical Activity: Not on file  Stress: Not on file  Social Connections: Not on file  Intimate Partner Violence: Not on file    Physical Exam: Vital signs in last 24 hours: @BP  (!) 112/92   Pulse (!) 50   Temp 98.1 F (36.7 C)   Ht 5\' 11"  (1.803 m)   Wt 170 lb (77.1 kg)   SpO2 99%   BMI 23.71 kg/m  GEN: NAD EYE: Sclerae anicteric ENT: MMM CV: Non-tachycardic Pulm: CTA b/l GI: Soft, NT/ND NEURO:  Alert & Oriented x 3   Erick Blinks, MD [facility]  12/06/2023 9:40 AM

## 2023-12-06 NOTE — Progress Notes (Signed)
Called to room to assist during endoscopic procedure.  Patient ID and intended procedure confirmed with present staff. Received instructions for my participation in the procedure from the performing physician.  

## 2023-12-09 ENCOUNTER — Telehealth: Payer: Self-pay | Admitting: *Deleted

## 2023-12-09 NOTE — Telephone Encounter (Signed)
No answer for post procedure followup call. Left VM. ?

## 2023-12-10 ENCOUNTER — Encounter: Payer: Self-pay | Admitting: Internal Medicine

## 2023-12-10 LAB — SURGICAL PATHOLOGY

## 2024-01-10 DIAGNOSIS — Z125 Encounter for screening for malignant neoplasm of prostate: Secondary | ICD-10-CM | POA: Diagnosis not present

## 2024-01-10 DIAGNOSIS — E785 Hyperlipidemia, unspecified: Secondary | ICD-10-CM | POA: Diagnosis not present

## 2024-01-17 DIAGNOSIS — Z1331 Encounter for screening for depression: Secondary | ICD-10-CM | POA: Diagnosis not present

## 2024-01-17 DIAGNOSIS — Z1339 Encounter for screening examination for other mental health and behavioral disorders: Secondary | ICD-10-CM | POA: Diagnosis not present

## 2024-01-17 DIAGNOSIS — Z0000 Encounter for general adult medical examination without abnormal findings: Secondary | ICD-10-CM | POA: Diagnosis not present

## 2024-01-17 DIAGNOSIS — G7000 Myasthenia gravis without (acute) exacerbation: Secondary | ICD-10-CM | POA: Diagnosis not present

## 2024-03-06 DIAGNOSIS — G7000 Myasthenia gravis without (acute) exacerbation: Secondary | ICD-10-CM | POA: Diagnosis not present

## 2024-03-12 DIAGNOSIS — R931 Abnormal findings on diagnostic imaging of heart and coronary circulation: Secondary | ICD-10-CM | POA: Diagnosis not present

## 2024-08-28 DIAGNOSIS — G7000 Myasthenia gravis without (acute) exacerbation: Secondary | ICD-10-CM | POA: Diagnosis not present

## 2024-08-28 DIAGNOSIS — H35361 Drusen (degenerative) of macula, right eye: Secondary | ICD-10-CM | POA: Diagnosis not present

## 2024-09-04 DIAGNOSIS — G7000 Myasthenia gravis without (acute) exacerbation: Secondary | ICD-10-CM | POA: Diagnosis not present
